# Patient Record
Sex: Male | Born: 2000 | Race: Black or African American | Hispanic: No | Marital: Single | Smoking: Never smoker
Health system: Southern US, Community
[De-identification: ages and names within clinical notes are randomized; demographics above are authoritative.]

## PROBLEM LIST (undated history)

## (undated) HISTORY — PX: CIRCUMCISION: SUR203

---
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
## 2020-01-07 DIAGNOSIS — J00 Acute nasopharyngitis [common cold]: Secondary | ICD-10-CM | POA: Diagnosis not present

## 2020-03-06 DIAGNOSIS — J069 Acute upper respiratory infection, unspecified: Secondary | ICD-10-CM | POA: Diagnosis not present

## 2020-03-06 DIAGNOSIS — Z20822 Contact with and (suspected) exposure to covid-19: Secondary | ICD-10-CM | POA: Diagnosis not present

## 2020-06-05 DIAGNOSIS — Z1152 Encounter for screening for COVID-19: Secondary | ICD-10-CM | POA: Diagnosis not present

## 2020-11-10 DIAGNOSIS — W450XXA Nail entering through skin, initial encounter: Secondary | ICD-10-CM | POA: Diagnosis not present

## 2020-11-10 DIAGNOSIS — S91331A Puncture wound without foreign body, right foot, initial encounter: Secondary | ICD-10-CM | POA: Diagnosis not present

## 2021-02-12 DIAGNOSIS — J029 Acute pharyngitis, unspecified: Secondary | ICD-10-CM | POA: Diagnosis not present

## 2021-02-12 DIAGNOSIS — Z03818 Encounter for observation for suspected exposure to other biological agents ruled out: Secondary | ICD-10-CM | POA: Diagnosis not present

## 2021-02-12 DIAGNOSIS — R059 Cough, unspecified: Secondary | ICD-10-CM | POA: Diagnosis not present

## 2021-02-12 DIAGNOSIS — Z20822 Contact with and (suspected) exposure to covid-19: Secondary | ICD-10-CM | POA: Diagnosis not present

## 2021-02-12 DIAGNOSIS — B349 Viral infection, unspecified: Secondary | ICD-10-CM | POA: Diagnosis not present

## 2021-04-18 DIAGNOSIS — Z03818 Encounter for observation for suspected exposure to other biological agents ruled out: Secondary | ICD-10-CM | POA: Diagnosis not present

## 2021-04-18 DIAGNOSIS — J069 Acute upper respiratory infection, unspecified: Secondary | ICD-10-CM | POA: Diagnosis not present

## 2021-04-18 DIAGNOSIS — J101 Influenza due to other identified influenza virus with other respiratory manifestations: Secondary | ICD-10-CM | POA: Diagnosis not present

## 2021-04-18 DIAGNOSIS — R051 Acute cough: Secondary | ICD-10-CM | POA: Diagnosis not present

## 2021-04-18 DIAGNOSIS — R0981 Nasal congestion: Secondary | ICD-10-CM | POA: Diagnosis not present

## 2021-08-07 DIAGNOSIS — Z03818 Encounter for observation for suspected exposure to other biological agents ruled out: Secondary | ICD-10-CM | POA: Diagnosis not present

## 2021-08-07 DIAGNOSIS — R509 Fever, unspecified: Secondary | ICD-10-CM | POA: Diagnosis not present

## 2021-08-07 DIAGNOSIS — R112 Nausea with vomiting, unspecified: Secondary | ICD-10-CM | POA: Diagnosis not present

## 2021-10-18 DIAGNOSIS — S76312A Strain of muscle, fascia and tendon of the posterior muscle group at thigh level, left thigh, initial encounter: Secondary | ICD-10-CM | POA: Diagnosis not present

## 2021-10-18 DIAGNOSIS — S8392XA Sprain of unspecified site of left knee, initial encounter: Secondary | ICD-10-CM | POA: Diagnosis not present

## 2021-11-05 DIAGNOSIS — F411 Generalized anxiety disorder: Secondary | ICD-10-CM | POA: Diagnosis not present

## 2021-11-20 DIAGNOSIS — F411 Generalized anxiety disorder: Secondary | ICD-10-CM | POA: Diagnosis not present
